# Patient Record
Sex: Female | Born: 2005 | Race: Black or African American | Hispanic: No | Marital: Single | Smoking: Never smoker
Health system: Southern US, Community
[De-identification: ages and names within clinical notes are randomized; demographics above are authoritative.]

## PROBLEM LIST (undated history)

## (undated) DIAGNOSIS — T7840XA Allergy, unspecified, initial encounter: Secondary | ICD-10-CM

## (undated) DIAGNOSIS — L309 Dermatitis, unspecified: Secondary | ICD-10-CM

## (undated) HISTORY — DX: Allergy, unspecified, initial encounter: T78.40XA

## (undated) HISTORY — DX: Dermatitis, unspecified: L30.9

---
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
## 2024-01-07 ENCOUNTER — Telehealth: Payer: Self-pay | Admitting: *Deleted

## 2024-01-07 ENCOUNTER — Ambulatory Visit: Payer: Self-pay | Admitting: Family Medicine

## 2024-01-07 ENCOUNTER — Other Ambulatory Visit (INDEPENDENT_AMBULATORY_CARE_PROVIDER_SITE_OTHER): Payer: Self-pay

## 2024-01-07 DIAGNOSIS — Z0000 Encounter for general adult medical examination without abnormal findings: Secondary | ICD-10-CM | POA: Diagnosis not present

## 2024-01-07 DIAGNOSIS — Z13220 Encounter for screening for lipoid disorders: Secondary | ICD-10-CM

## 2024-01-07 LAB — LIPID PANEL
Cholesterol: 123 mg/dL (ref 0–200)
HDL: 54.9 mg/dL (ref >39.00)
LDL Cholesterol: 61 mg/dL (ref 0–99)
NonHDL: 68.09
Total CHOL/HDL Ratio: 2
Triglycerides: 36 mg/dL (ref 0.0–149.0)
VLDL: 7.2 mg/dL (ref 0.0–40.0)

## 2024-01-07 LAB — COMPREHENSIVE METABOLIC PANEL WITH GFR
ALT: 10 U/L (ref 0–35)
AST: 14 U/L (ref 0–37)
Albumin: 4.3 g/dL (ref 3.5–5.2)
Alkaline Phosphatase: 48 U/L (ref 47–119)
BUN: 15 mg/dL (ref 6–23)
CO2: 27 meq/L (ref 19–32)
Calcium: 9.6 mg/dL (ref 8.4–10.5)
Chloride: 105 meq/L (ref 96–112)
Creatinine, Ser: 0.87 mg/dL (ref 0.40–1.20)
GFR: 96.69 mL/min (ref >60.00)
Glucose, Bld: 77 mg/dL (ref 70–99)
Potassium: 4.2 meq/L (ref 3.5–5.1)
Sodium: 139 meq/L (ref 135–145)
Total Bilirubin: 0.6 mg/dL (ref 0.2–1.2)
Total Protein: 6.6 g/dL (ref 6.0–8.3)

## 2024-01-07 LAB — CBC WITH DIFFERENTIAL/PLATELET
Basophils Absolute: 0 K/uL (ref 0.0–0.1)
Basophils Relative: 0.5 % (ref 0.0–3.0)
Eosinophils Absolute: 0.1 K/uL (ref 0.0–0.7)
Eosinophils Relative: 2.6 % (ref 0.0–5.0)
HCT: 37.3 % (ref 36.0–49.0)
Hemoglobin: 12.5 g/dL (ref 12.0–16.0)
Lymphocytes Relative: 41.3 % (ref 24.0–48.0)
Lymphs Abs: 2.2 K/uL (ref 0.7–4.0)
MCHC: 33.4 g/dL (ref 31.0–37.0)
MCV: 92.6 fl (ref 78.0–98.0)
Monocytes Absolute: 0.3 K/uL (ref 0.1–1.0)
Monocytes Relative: 6.3 % (ref 3.0–12.0)
Neutro Abs: 2.7 K/uL (ref 1.4–7.7)
Neutrophils Relative %: 49.3 % (ref 43.0–71.0)
Platelets: 300 K/uL (ref 150.0–575.0)
RBC: 4.03 Mil/uL (ref 3.80–5.70)
RDW: 12.2 % (ref 11.4–15.5)
WBC: 5.4 K/uL (ref 4.5–13.5)

## 2024-01-07 LAB — TSH: TSH: 1.43 u[IU]/mL (ref 0.40–5.00)

## 2024-01-07 NOTE — Telephone Encounter (Addendum)
 Pt just had labs for a CPE but we realized she doesn't have the CPE scheduled. Pt no-showed last CPE appt but per Dr. Randeen, CPE needs to be r/s since pt had the labs. Thanks

## 2024-01-08 NOTE — Telephone Encounter (Signed)
 Spoke to pt, sch cpe for 01/14/24

## 2024-01-13 ENCOUNTER — Encounter: Payer: Self-pay | Admitting: Family Medicine

## 2024-01-14 ENCOUNTER — Ambulatory Visit (INDEPENDENT_AMBULATORY_CARE_PROVIDER_SITE_OTHER): Admitting: Family Medicine

## 2024-01-14 ENCOUNTER — Encounter: Payer: Self-pay | Admitting: Family Medicine

## 2024-01-14 VITALS — BP 118/58 | HR 66 | Temp 98.5°F | Ht 62.5 in | Wt 141.1 lb

## 2024-01-14 DIAGNOSIS — L309 Dermatitis, unspecified: Secondary | ICD-10-CM | POA: Insufficient documentation

## 2024-01-14 DIAGNOSIS — Z0000 Encounter for general adult medical examination without abnormal findings: Secondary | ICD-10-CM | POA: Diagnosis not present

## 2024-01-14 DIAGNOSIS — N920 Excessive and frequent menstruation with regular cycle: Secondary | ICD-10-CM

## 2024-01-14 DIAGNOSIS — N760 Acute vaginitis: Secondary | ICD-10-CM | POA: Diagnosis not present

## 2024-01-14 MED ORDER — CLOBETASOL PROPIONATE 0.05 % EX OINT: 1.0000 | TOPICAL_OINTMENT | Freq: Two times a day (BID) | CUTANEOUS | 5 refills | Status: AC

## 2024-01-14 NOTE — Patient Instructions (Addendum)
 Get back with your gynecologist about the vaginal symptoms   Try over the counter probiotic for vaginal health  Or eat yogurt daily or other probiotic food  This can help    Labs look good  Keep up with pilates   Take care of yourself   I refilled your eczema cream

## 2024-01-14 NOTE — Assessment & Plan Note (Signed)
 Uses clobetasol  prn 0.5% Refilled today  Worse in summer

## 2024-01-14 NOTE — Progress Notes (Signed)
 Subjective:    Patient ID: Robin Wang, female    DOB: [birth year], 18 y.o.   MRN: [id number]  HPI  Here for health maintenance exam and to review chronic medical problems   Wt Readings from Last 3 Encounters:  01/14/24 141 lb 2 oz (64 kg) (72%, Z= 0.59)*  12/07/22 134 lb 8 oz (61 kg) (68%, Z= 0.46)*  02/20/22 138 lb 4 oz (62.7 kg) (75%, Z= 0.68)*   * Growth percentiles are based on CDC (Girls, 2-20 Years) data.   25.40 kg/m (82%, Z= 0.90, Source: CDC (Girls, 2-20 Years))  Vitals:   01/14/24 0854  BP: (!) 118/58  Pulse: 66  Temp: 98.5 F (36.9 C)  SpO2: 100%    Immunization History  Administered Date(s) Administered   DTaP 11/25/2004, 01/27/2005, 03/30/2005, 10/02/2005, 09/30/2009   HPV 9-valent 12/15/2016, 02/15/2017, 07/19/2017   Hepatitis B, PED/ADOLESCENT 07[birth year]13[birth year]04, 11/30/2004, 03/30/2005   Influenza Whole 05/04/2008   Influenza,inj,Quad PF,6+ Mos 02/18/2019   Meningococcal Conjugate 12/15/2016   Meningococcal Mcv4o 02/20/2022   PFIZER Comirnaty(Gray Top)Covid-19 Tri-Sucrose Vaccine 10/25/2020   PFIZER(Purple Top)SARS-COV-2 Vaccination 11/17/2019, 12/08/2019   Tdap 12/15/2016   Varicella 12/04/2006, 01/09/2014    Health Maintenance Due  Topic Date Due   HIV Screening  Never done   Meningococcal B Vaccine (1 of 2 - Standard) Never done   Hepatitis C Screening  Never done   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, learning disability both semesters  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  Happy there  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    Self breast exam- no lumps    Gyn health- gyn in [place] / wom[facility]  Also goes to student health center   Has IUD -mirena  Light period  Pretty regular    Recurrent BV (?)   Had yeast infection in feb and treated at health center - took diflucan 2 pill course  Then treated with boric acid  Did have BV in October - did metronidazole   Had HPV vaccine   Has used boric acid / azo  Moistat boric acid feminine  cleanser   Keeps an odor  A little better since starting the wash  .bon   STD screening - normal at school  Not currently active    Colon cancer screening -no fam history   Bone health   Falls-none Fractures-none  Supplements -vit C     Exercise  Pilates  Walking regularly  Also walks to class     Mood    01/14/2024    9:01 AM 12/07/2022   10:10 AM  Depression screen PHQ 2/9  Decreased Interest 0 0  Down, Depressed, Hopeless 0 0  PHQ - 2 Score 0 0  Altered sleeping 0 0  Tired, decreased energy 1 1  Change in appetite 1 1  Feeling bad or failure about yourself  0 0  Trouble concentrating 0 0  Moving slowly or fidgety/restless 0 0  Suicidal thoughts 0 0  PHQ-9 Score 2 2  Difficult doing work/chores Not difficult at all Not difficult at all    Cholesterol Lab Results  Component Value Date   CHOL 123 01/07/2024   CHOL 119 12/07/2022   Lab Results  Component Value Date   HDL 54.90 01/07/2024   HDL 54.80 12/07/2022   Lab Results  Component Value Date   LDLCALC 61 01/07/2024   LDLCALC 57 12/07/2022   Lab Results  Component Value Date   TRIG  36.0 01/07/2024   TRIG 37.0 12/07/2022   Lab Results  Component Value Date   CHOLHDL 2 01/07/2024   CHOLHDL 2 12/07/2022   No results found for: LDLDIRECT   Lab Results  Component Value Date   NA 139 01/07/2024   K 4.2 01/07/2024   CO2 27 01/07/2024   GLUCOSE 77 01/07/2024   BUN 15 01/07/2024   CREATININE 0.87 01/07/2024   CALCIUM 9.6 01/07/2024   GFR 96.69 01/07/2024   Lab Results  Component Value Date   ALT 10 01/07/2024   AST 14 01/07/2024   ALKPHOS 48 01/07/2024   BILITOT 0.6 01/07/2024   Lab Results  Component Value Date   WBC 5.4 01/07/2024   HGB 12.5 01/07/2024   HCT 37.3 01/07/2024   MCV 92.6 01/07/2024   PLT 300.0 01/07/2024   Lab Results  Component Value Date   TSH 1.43 01/07/2024       Patient Active Problem List   Diagnosis Date Noted   Eczema 01/14/2024    Recurrent vaginitis 01/14/2024   Routine general medical examination at a health care facility 12/07/2022   Screening-pulmonary TB 12/07/2022   Heavy menses 12/07/2022   Allergic reaction to food 01/09/2014   Allergic rhinitis 03/21/2013   Acne 10/19/2006   Past Medical History:  Diagnosis Date   Allergy    allegic rhinitis   Eczema    mild   History reviewed. No pertinent surgical history. Social History   Tobacco Use   Smoking status: Never    Passive exposure: Never   Smokeless tobacco: Never   Tobacco comments:    no smoking in home  Substance Use Topics   Alcohol use: No    Alcohol/week: 0.0 standard drinks of alcohol   Drug use: No   Family History  Problem Relation Age of Onset   Allergies Mother    Allergies  Allergen Reactions   Cinnamon Hives    Artificial cinnamon   Food     LIMA BEANS   Current Outpatient Medications on File Prior to Visit  Medication Sig Dispense Refill   cetirizine (ZYRTEC) 10 MG chewable tablet Chew 10 mg by mouth daily as needed for allergies.     EPINEPHrine  0.3 mg/0.3 mL IJ SOAJ injection Inject 0.3 mg into the muscle as needed for anaphylaxis. 1 each 0   fluticasone  (FLONASE ) 50 MCG/ACT nasal spray Place 1 spray into both nostrils daily as needed for allergies or rhinitis. 16 g 11   No current facility-administered medications on file prior to visit.    Review of Systems  Constitutional:  Negative for activity change, appetite change, fatigue, fever and unexpected weight change.  HENT:  Negative for congestion, ear pain, rhinorrhea, sinus pressure and sore throat.   Eyes:  Negative for pain, redness and visual disturbance.  Respiratory:  Negative for cough, shortness of breath and wheezing.   Cardiovascular:  Negative for chest pain and palpitations.  Gastrointestinal:  Negative for abdominal pain, blood in stool, constipation and diarrhea.  Endocrine: Negative for polydipsia and polyuria.  Genitourinary:  Negative for  dysuria, frequency, urgency, vaginal discharge and vaginal pain.       Vaginal odor -comes and goes  No discomfort or itching currently  Musculoskeletal:  Negative for arthralgias, back pain and myalgias.  Skin:  Negative for pallor and rash.  Allergic/Immunologic: Negative for environmental allergies.  Neurological:  Negative for dizziness, syncope and headaches.  Hematological:  Negative for adenopathy. Does not bruise/bleed easily.  Psychiatric/Behavioral:  Negative  for decreased concentration and dysphoric mood. The patient is not nervous/anxious.        Objective:   Physical Exam Constitutional:      General: She is not in acute distress.    Appearance: Normal appearance. She is well-developed and normal weight. She is not ill-appearing or diaphoretic.  HENT:     Head: Normocephalic and atraumatic.     Right Ear: Tympanic membrane, ear canal and external ear normal.     Left Ear: Tympanic membrane, ear canal and external ear normal.     Nose: Nose normal. No congestion.     Mouth/Throat:     Mouth: Mucous membranes are moist.     Pharynx: Oropharynx is clear. No posterior oropharyngeal erythema.  Eyes:     General: No scleral icterus.    Extraocular Movements: Extraocular movements intact.     Conjunctiva/sclera: Conjunctivae normal.     Pupils: Pupils are equal, round, and reactive to light.  Neck:     Thyroid: No thyromegaly.     Vascular: No carotid bruit or JVD.  Cardiovascular:     Rate and Rhythm: Normal rate and regular rhythm.     Pulses: Normal pulses.     Heart sounds: Normal heart sounds.     No gallop.  Pulmonary:     Effort: Pulmonary effort is normal. No respiratory distress.     Breath sounds: Normal breath sounds. No wheezing.     Comments: Good air exch Chest:     Chest wall: No tenderness.  Abdominal:     General: Bowel sounds are normal. There is no distension or abdominal bruit.     Palpations: Abdomen is soft. There is no mass.     Tenderness:  There is no abdominal tenderness.     Hernia: No hernia is present.  Genitourinary:    Comments: Breast and pelvic exam are done by gyn provider   Musculoskeletal:        General: No tenderness. Normal range of motion.     Cervical back: Normal range of motion and neck supple. No rigidity. No muscular tenderness.     Right lower leg: No edema.     Left lower leg: No edema.     Comments: No kyphosis   Lymphadenopathy:     Cervical: No cervical adenopathy.  Skin:    General: Skin is warm and dry.     Coloration: Skin is not pale.     Findings: No erythema or rash.     Comments: No active eczema today  Neurological:     Mental Status: She is alert. Mental status is at baseline.     Cranial Nerves: No cranial nerve deficit.     Motor: No abnormal muscle tone.     Coordination: Coordination normal.     Gait: Gait normal.     Deep Tendon Reflexes: Reflexes are normal and symmetric. Reflexes normal.  Psychiatric:        Mood and Affect: Mood normal.        Cognition and Memory: Cognition and memory normal.           Assessment & Plan:   Problem List Items Addressed This Visit       Musculoskeletal and Integument   Eczema   Uses clobetasol  prn 0.5% Refilled today  Worse in summer           Genitourinary   Recurrent vaginitis   Pt has had intermittent yeast and BV since getting IUD  ?  If vaginal PH change Is using an over the counter boric acid/wash -helpful Encouraged trial of probiotic or probiotic food  Has had STD screen  Recommend follow up with gyn for this         Other   Routine general medical examination at a health care facility - Primary   Reviewed health habits including diet and exercise and skin cancer prevention Reviewed appropriate screening tests for age  Also reviewed health mt list, fam hx and immunization status , as well as social and family history   See HPI Labs reviewed and ordered Health Maintenance  Topic Date Due   HIV  Screening  Never done   Meningitis B Vaccine (1 of 2 - Standard) Never done   Hepatitis C Screening  Never done   COVID-19 Vaccine (4 - 2024-25 season) 01/29/2025*   Flu Shot  01/25/2024   DTaP/Tdap/Td vaccine (7 - Td or Tdap) 12/16/2026   Hepatitis B Vaccine  Completed   HPV Vaccine  Completed  *Topic was postponed. The date shown is not the original due date.    Encouraged to continue self breast exams Encouraged to follow up with her gyn regarding frequent vaginitis  Continues IUD  Had STD screening this year/not currently active  Discussed fall prevention, supplements and exercise for bone density  PHQ 2 (sometimes fatigue)  Labs reviewed        Heavy menses   Improved with mirena IUD

## 2024-01-14 NOTE — Assessment & Plan Note (Signed)
 Reviewed health habits including diet and exercise and skin cancer prevention Reviewed appropriate screening tests for age  Also reviewed health mt list, fam hx and immunization status , as well as social and family history   See HPI Labs reviewed and ordered Health Maintenance  Topic Date Due   HIV Screening  Never done   Meningitis B Vaccine (1 of 2 - Standard) Never done   Hepatitis C Screening  Never done   COVID-19 Vaccine (4 - 2024-25 season) 01/29/2025*   Flu Shot  01/25/2024   DTaP/Tdap/Td vaccine (7 - Td or Tdap) 12/16/2026   Hepatitis B Vaccine  Completed   HPV Vaccine  Completed  *Topic was postponed. The date shown is not the original due date.    Encouraged to continue self breast exams Encouraged to follow up with her gyn regarding frequent vaginitis  Continues IUD  Had STD screening this year/not currently active  Discussed fall prevention, supplements and exercise for bone density  PHQ 2 (sometimes fatigue)  Labs reviewed

## 2024-01-14 NOTE — Assessment & Plan Note (Signed)
 Pt has had intermittent yeast and BV since getting IUD  ? If vaginal PH change Is using an over the counter boric acid/wash -helpful Encouraged trial of probiotic or probiotic food  Has had STD screen  Recommend follow up with gyn for this

## 2024-01-14 NOTE — Assessment & Plan Note (Signed)
 Improved with mirena IUD

## 2024-01-18 NOTE — Telephone Encounter (Signed)
 Copied from CRM [id number]. Topic: General - Other >> Jan 18, 2024  3:10 PM Macario HERO wrote: Reason for CRM: Patient is calling to speak to Dr. Randeen or her nurse regarding a recent positive BV testing done by her gynecologist. Patient stated that she was not prescribed any medication and is calling for next steps.

## 2024-01-18 NOTE — Telephone Encounter (Signed)
 I can see the note in care everywhere with the positive test for BV but there is no assessment /plan portion of note and no indication of what was done  ? Maybe they were not aware how symptomatic she is ?   I recommend she call their office and ask for clarification and treatment plan  Thanks for the heads up

## 2024-01-21 ENCOUNTER — Other Ambulatory Visit: Payer: Self-pay

## 2024-01-21 NOTE — Telephone Encounter (Signed)
 Spoke with patient, advised of Dr. Elsworth message. She verbalized understanding and stated she would reach out to GYN office.

## 2024-12-29 ENCOUNTER — Other Ambulatory Visit: Payer: Self-pay
